# Patient Record
Sex: Female | Born: 1995 | Race: White | Hispanic: No | Marital: Single | Smoking: Never smoker
Health system: Southern US, Community
[De-identification: ages and names within clinical notes are randomized; demographics above are authoritative.]

## PROBLEM LIST (undated history)

## (undated) DIAGNOSIS — R519 Headache, unspecified: Secondary | ICD-10-CM

## (undated) DIAGNOSIS — F4310 Post-traumatic stress disorder, unspecified: Secondary | ICD-10-CM

## (undated) DIAGNOSIS — S0990XA Unspecified injury of head, initial encounter: Secondary | ICD-10-CM

## (undated) DIAGNOSIS — F5000 Anorexia nervosa, unspecified: Secondary | ICD-10-CM

## (undated) DIAGNOSIS — F419 Anxiety disorder, unspecified: Secondary | ICD-10-CM

## (undated) DIAGNOSIS — F32A Depression, unspecified: Secondary | ICD-10-CM

## (undated) DIAGNOSIS — S060XAA Concussion with loss of consciousness status unknown, initial encounter: Secondary | ICD-10-CM

## (undated) HISTORY — DX: Unspecified injury of head, initial encounter: S09.90XA

## (undated) HISTORY — DX: Headache, unspecified: R51.9

## (undated) HISTORY — DX: Post-traumatic stress disorder, unspecified: F43.10

## (undated) HISTORY — DX: Anorexia nervosa, unspecified: F50.00

## (undated) HISTORY — DX: Anxiety disorder, unspecified: F41.9

## (undated) HISTORY — DX: Depression, unspecified: F32.A

## (undated) HISTORY — DX: Concussion with loss of consciousness status unknown, initial encounter: S06.0XAA

---

## 2015-04-30 ENCOUNTER — Ambulatory Visit: Payer: Self-pay | Admitting: *Deleted

## 2016-02-29 ENCOUNTER — Other Ambulatory Visit (HOSPITAL_BASED_OUTPATIENT_CLINIC_OR_DEPARTMENT_OTHER): Payer: Self-pay | Admitting: Physician Assistant

## 2016-02-29 DIAGNOSIS — R5383 Other fatigue: Secondary | ICD-10-CM

## 2016-02-29 DIAGNOSIS — R109 Unspecified abdominal pain: Secondary | ICD-10-CM

## 2016-02-29 DIAGNOSIS — R110 Nausea: Secondary | ICD-10-CM

## 2016-04-05 ENCOUNTER — Ambulatory Visit (HOSPITAL_BASED_OUTPATIENT_CLINIC_OR_DEPARTMENT_OTHER): Admission: RE | Admit: 2016-04-05 | Payer: Self-pay | Source: Ambulatory Visit

## 2016-04-08 ENCOUNTER — Ambulatory Visit (HOSPITAL_BASED_OUTPATIENT_CLINIC_OR_DEPARTMENT_OTHER)
Admission: RE | Admit: 2016-04-08 | Discharge: 2016-04-08 | Disposition: A | Discharge status: Home / Self Care | Payer: Medicaid Other | Source: Ambulatory Visit | Attending: Physician Assistant | Admitting: Physician Assistant

## 2016-04-08 ENCOUNTER — Ambulatory Visit (HOSPITAL_BASED_OUTPATIENT_CLINIC_OR_DEPARTMENT_OTHER): Payer: Medicaid Other

## 2016-04-08 DIAGNOSIS — R109 Unspecified abdominal pain: Secondary | ICD-10-CM | POA: Diagnosis not present

## 2016-04-08 DIAGNOSIS — R110 Nausea: Secondary | ICD-10-CM | POA: Diagnosis not present

## 2016-04-08 DIAGNOSIS — R5383 Other fatigue: Secondary | ICD-10-CM | POA: Insufficient documentation

## 2017-04-22 IMAGING — US US ABDOMEN COMPLETE
1 series · 14 of 25 positions shown · non-contrast
Comparison: None.

CLINICAL DATA: 19-year-old female with intermittent abdominal pain,
nausea, and fatigue. Initial encounter.

EXAM:
ABDOMEN ULTRASOUND COMPLETE

[Series 1: us abdomen complete · 0.15mm/px · 14 of 66 slices shown]
[im 1/66]
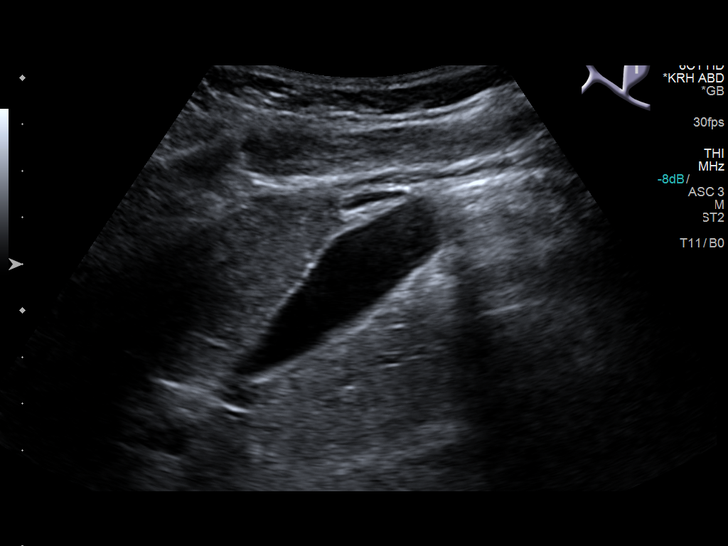
[im 6/66]
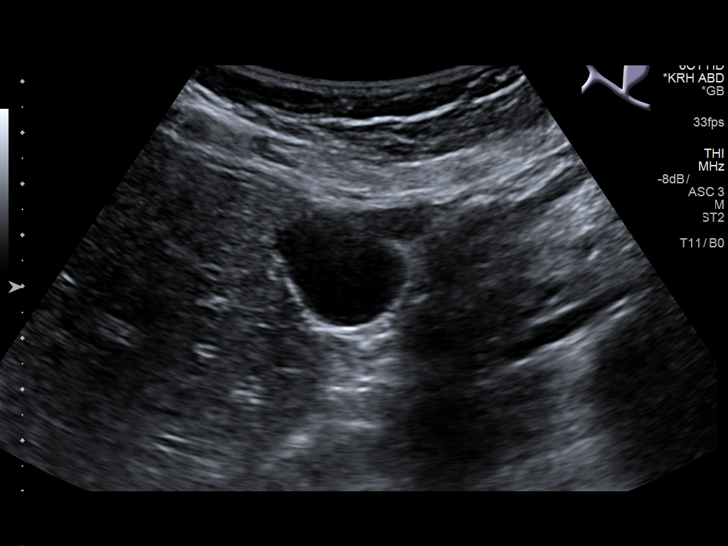
[im 11/66]
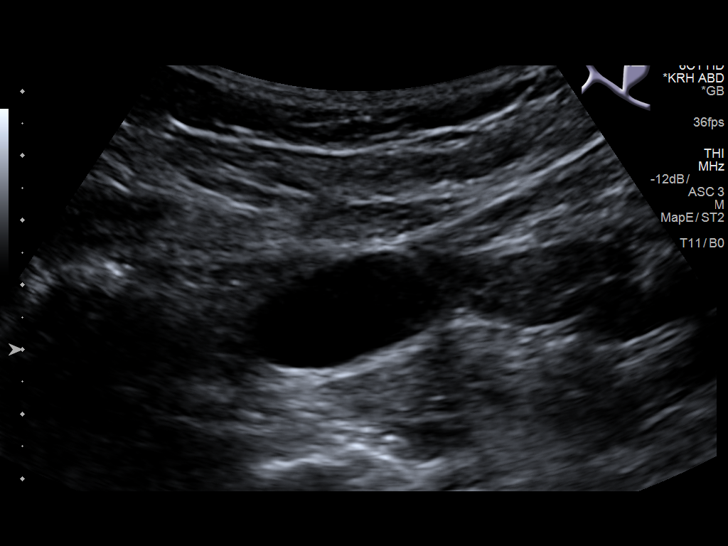
[im 17/66]
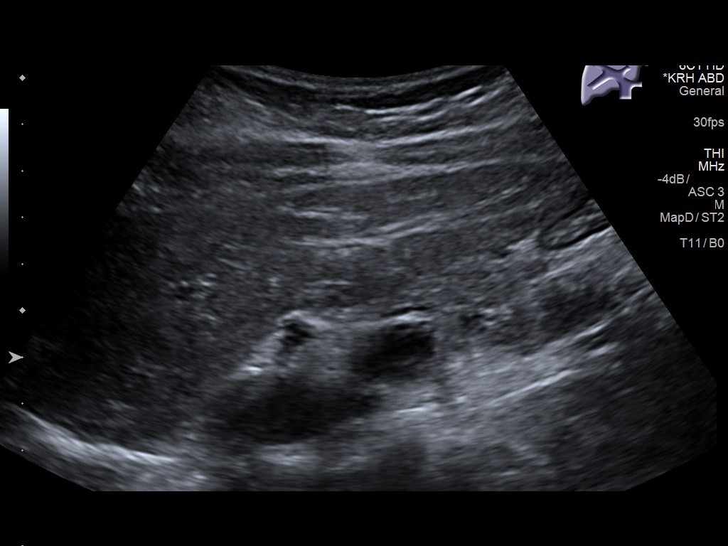
[im 22/66]
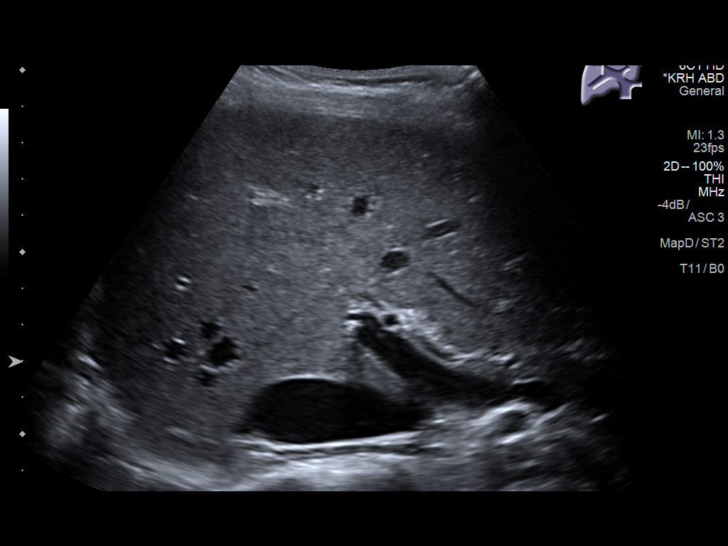
[im 25/66]
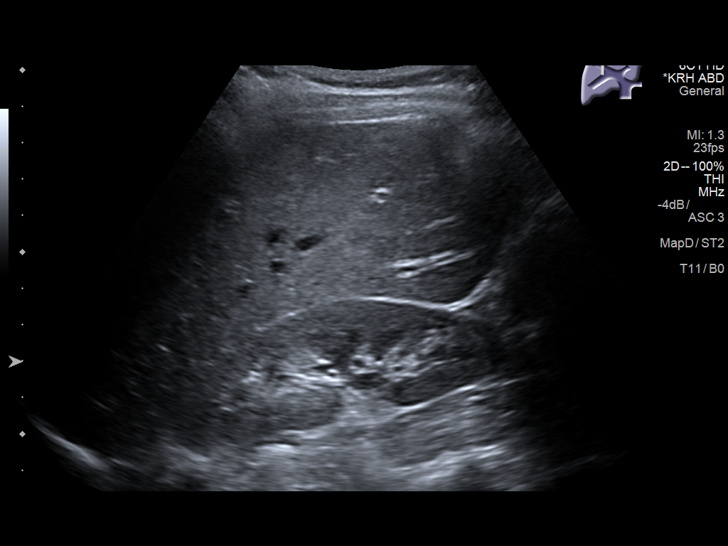
[im 30/66]
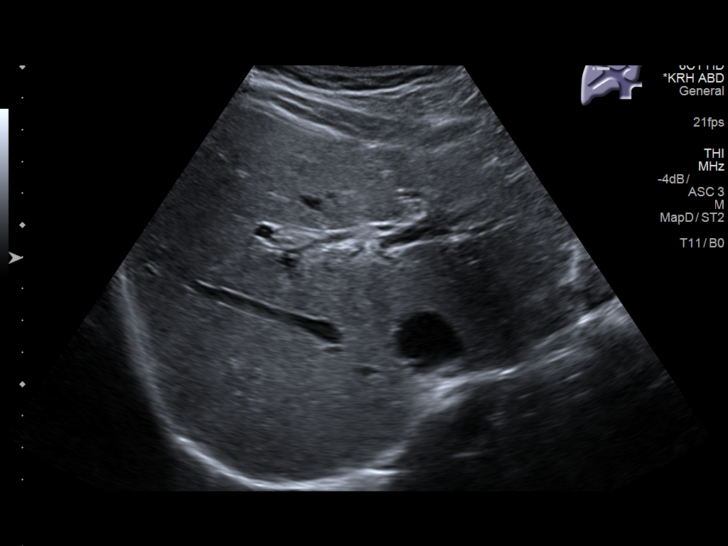
[im 36/66]
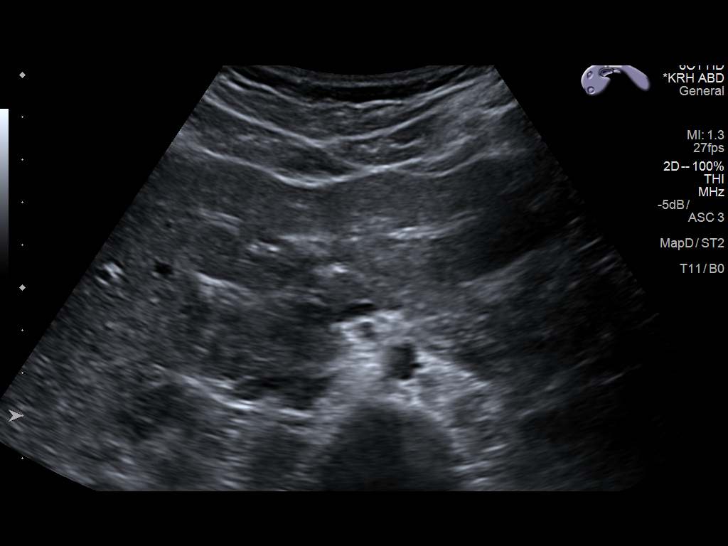
[im 41/66]
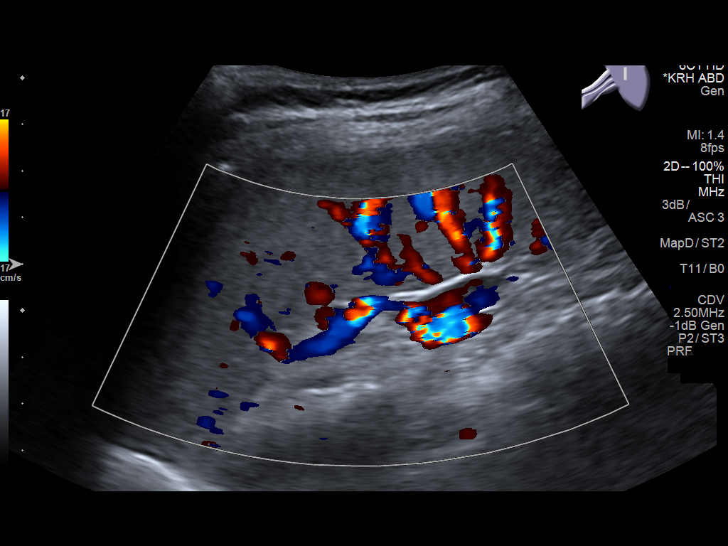
[im 44/66]
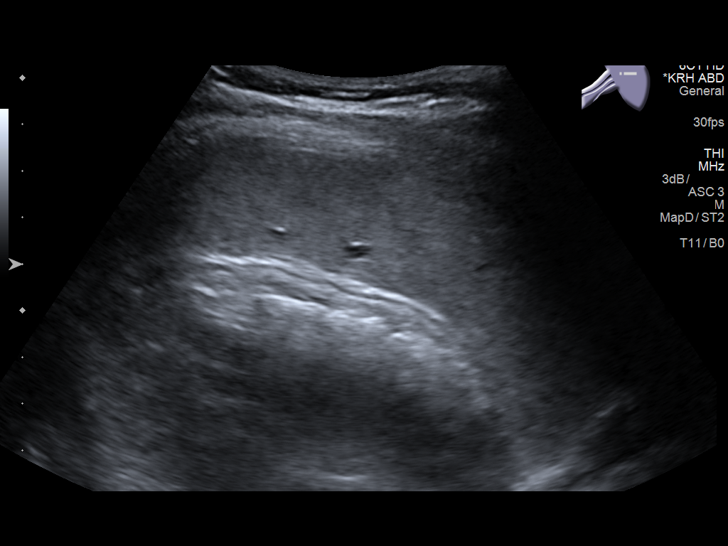
[im 49/66]
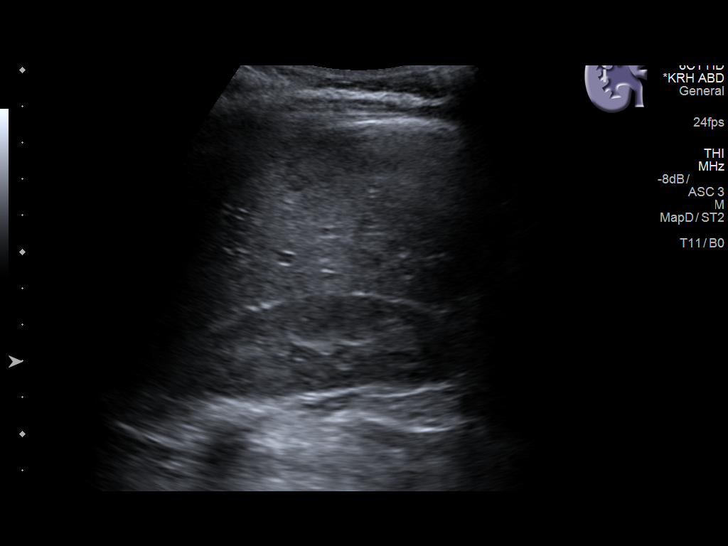
[im 55/66]
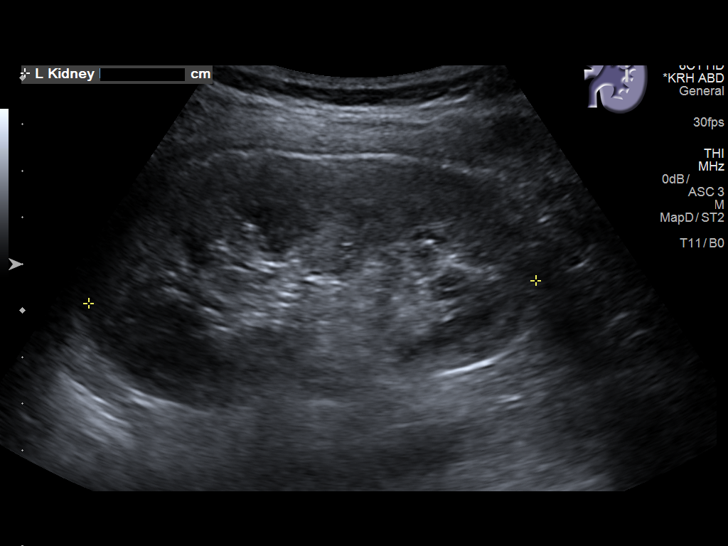
[im 60/66]
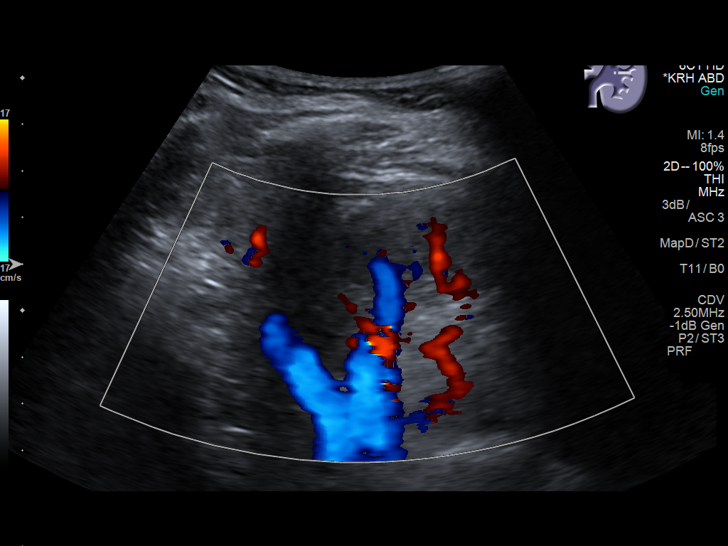
[im 66/66]
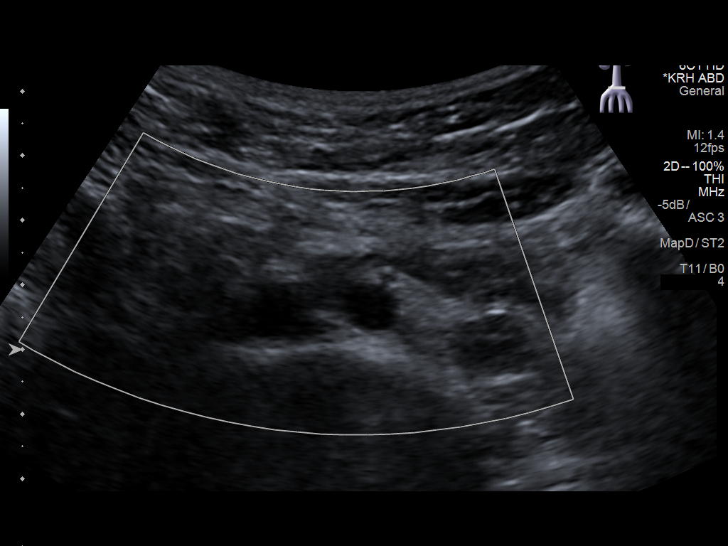

[14 of 25 positions shown; findings below may reference images not displayed]

FINDINGS: Gallbladder: No gallstones or wall thickening visualized. No
sonographic Murphy sign noted by sonographer.

Common bile duct: Diameter: 2 mm, normal

Liver: No focal lesion identified.  Normal parenchymal echogenicity.

IVC: No abnormality visualized.

Pancreas: Visualized portion unremarkable.

Spleen: Size and appearance within normal limits.

Right Kidney: Length: 10.1 cm. Echogenicity within normal limits. No
mass or hydronephrosis visualized.

Left Kidney: Length: 9.6 cm. Echogenicity within normal limits. No
mass or hydronephrosis visualized.

Abdominal aorta: No aneurysm visualized.

Other findings: No free fluid.
IMPRESSION: Normal abdomen ultrasound.

## 2021-11-15 ENCOUNTER — Ambulatory Visit: Payer: Federal, State, Local not specified - PPO | Admitting: Psychiatry

## 2021-12-06 ENCOUNTER — Other Ambulatory Visit: Payer: Self-pay | Admitting: *Deleted

## 2021-12-06 ENCOUNTER — Encounter: Payer: Self-pay | Admitting: *Deleted

## 2021-12-07 ENCOUNTER — Other Ambulatory Visit: Payer: Self-pay

## 2021-12-07 ENCOUNTER — Encounter: Payer: Self-pay | Admitting: Psychiatry

## 2021-12-07 ENCOUNTER — Ambulatory Visit: Payer: Federal, State, Local not specified - PPO | Admitting: Psychiatry

## 2021-12-07 VITALS — BP 134/80 | HR 91 | Ht 66.0 in | Wt 117.0 lb

## 2021-12-07 DIAGNOSIS — R519 Headache, unspecified: Secondary | ICD-10-CM

## 2021-12-07 DIAGNOSIS — F0781 Postconcussional syndrome: Secondary | ICD-10-CM | POA: Diagnosis not present
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
## 2021-12-07 NOTE — Patient Instructions (Addendum)
Start magnesium 500 mg daily for headache prevention Refer to speech therapy for cognitive rehab  Post Concussive Syndrome:  Post-concussion syndrome is a complex disorder in which various symptoms -- such as headaches and dizziness -- last for weeks and sometimes months after the injury that caused the concussion. Concussion is a mild traumatic brain injury, usually occurring after a blow to the head. Loss of consciousness isn't required for a diagnosis of concussion or post-concussion syndrome. In fact, the risk of post-concussion syndrome doesn't appear to be associated with the severity of the initial injury. In most people, post-concussion syndrome symptoms occur within the first seven to 10 days and go away within three months, though they can persist for a year or more. Post-concussion syndrome treatments are aimed at easing specific symptoms.  Post-concussion symptoms include: Headaches  Dizziness  Fatigue  Irritability  Anxiety  Insomnia  Loss of concentration and memory  Noise and light sensitivity  Headaches that occur after a concussion can vary and may feel like tension-type headaches or migraines. Most, however, are tension-type headaches, which may be associated with a neck injury that happened at the same time as the head injury. In some cases, people experience behavior or emotional changes after a mild traumatic brain injury. Family members may notice that the person has become more irritable, suspicious, argumentative or stubborn. When to see a doctor See a doctor if you experience a head injury severe enough to cause confusion or amnesia -- even if you never lost consciousness. If a concussion occurs while you're playing a sport, don't go back in the game. Seek medical attention so that you don't risk worsening your injury. Causes: Some experts believe post-concussion symptoms are caused by structural damage to the brain or disruption of neurotransmitter systems,  resulting from the impact that caused the concussion. Others believe post-concussion symptoms are related to psychological factors, especially since the most common symptoms -- headache, dizziness and sleep problems -- are similar to those often experienced by people diagnosed with depression, anxiety or post-traumatic stress disorder. In many cases, both physiological effects of brain trauma and emotional reactions to these effects play a role in the development of symptoms. Researchers haven't determined why some people who've had concussions develop persistent post-concussion symptoms while others do not. No proven correlation between the severity of the injury and the likelihood of developing persistent post-concussion symptoms exists.  Risk Factors: Risk factors for developing post-concussion syndrome include: Age. Studies have found increasing age to be a risk factor for post-concussion syndrome.  Sex. Women are more likely to be diagnosed with post-concussion syndrome, but this may be because women are generally more likely to seek medical care.  Trauma. Concussions resulting from car collisions, falls, assaults and sports injuries are commonly associated with post-concussion syndrome. Treatment: There is no specific treatment for post-concussion syndrome. Instead, your doctor will treat the individual symptoms you're experiencing. The types of symptoms and their frequency are unique to each person. Headaches Medications commonly used for migraines or tension headaches, including some antidepressants, appear to be effective when these types of headaches are associated with post-concussion syndrome. Examples include: Amitriptyline. This medication has been widely used for post-traumatic injuries, as well as for symptoms commonly associated with post-concussion syndrome, such as irritability, dizziness and depression. Topiramate. Commonly used to treat migraines, topiramate (Qudexy XR, Topamax,  Trokendi XR) may be effective in reducing headaches after head injury. Common side effects of topiramate include weight loss and cognitive problems.  Gabapentin. Gabapentin (Gralise, Neurontin)  is frequently used to treat a variety of types of pain and may be helpful in treating post-traumatic headaches. A common side effect of gabapentin is drowsiness.  Other agents used to treat migraines and tension-type headaches may also be helpful in some individuals. Keep in mind that the overuse of over-the-counter and prescription pain relievers may contribute to persistent post-concussion headaches. Memory and thinking problems No medications are currently recommended specifically for the treatment of cognitive problems after mild traumatic brain injury. Time may be the best therapy for post-concussion syndrome if you have cognitive problems, as most of them go away on their own in the weeks to months following the injury. Certain forms of cognitive therapy may be helpful, including focused rehabilitation that provides training in how to use a pocket calendar, Education officer, community or other techniques to work around memory deficits and attention skills. Relaxation therapy also may help. Dizziness Vestibular rehab (a specialized form of physical therapy can help this. Depression and anxiety The symptoms of post-concussion syndrome often improve after the affected person learns that there is a cause for his or her symptoms and that they will likely improve with time. Education about the disorder can ease a person's fears and help provide peace of mind. If you're experiencing new or increasing depression or anxiety after a concussion, some treatment options include: Psychotherapy. It may be helpful to discuss your concerns with a psychologist or psychiatrist who has experience in working with people with brain injury.  Medication. To combat anxiety or depression, antidepressants or anti-anxiety medications may be  prescribed. Prevention: The only known way to prevent post-concussion syndrome is to avoid the head injury in the first place. Avoiding head injuries Although you can't prepare for every potential situation, here are some tips for avoiding common causes of head injuries: Fasten your seat belt whenever you're traveling in a car, and be sure children are in age-appropriate safety seats. Children under 13 are safest riding in the back seat, especially if your car has air bags.  Use helmets whenever you or your children are bicycling, roller-skating, in-line skating, ice-skating, skiing, snowboarding, playing football, batting or running the bases in softball or baseball, skateboarding, or horseback riding. Wear a helmet when riding a motorcycle.  Take steps around the house to prevent falls, such as removing small area rugs, improving lighting and installing handrails.

## 2022-02-07 ENCOUNTER — Telehealth: Payer: Self-pay | Admitting: Psychiatry

## 2022-02-07 NOTE — Telephone Encounter (Signed)
Called patient, LVM requesting  a call back tomorrow. I informed her Dr Delena Bali is out of office x 2 weeks. Left #.   ?

## 2022-02-07 NOTE — Telephone Encounter (Signed)
Pt wanting to schedule a sooner appt or speak with nurse about Dr. Phoebe Sharps for work. ? Pt wanting to take time off work due to concussion symptoms, needs a doctors note.  ?Pt would like a call from the nurse.  ?[phone] ?

## 2022-02-08 NOTE — Telephone Encounter (Addendum)
I will NOT write working excuse for the reason listed ?

## 2022-02-08 NOTE — Telephone Encounter (Signed)
Patient returned call, stated her brain feels fatigued quickly. She works at a [employer] all day. She  has been unplugging from screens on weekends and not watching, just listening. She feels like her brain is burning from mid day to end of work day. She is wearing glasses with blue light filter and night light on her computer screen. When she steps away from the computer it goes away after 30 minutes.  She has projects at work she is trying to complete but is asking for a letter to be out of work 02/14/22 through 02/18/22 to "let her brain rest".  She is hoping this will correct her issue. ?I advised will send her request to Dr Terrace Arabia as work in [place] this morning, will let her know Dr Zannie Cove reply. Patient verbalized understanding, appreciation. ? ?  ? ?

## 2022-02-09 NOTE — Telephone Encounter (Signed)
Called patient and informed her Dr Krista Blue denied writing letter for her for reason listed. She will call back when Dr Billey Gosling is back in office, verbalized understanding, appreciation. ? ?

## 2022-02-23 ENCOUNTER — Encounter: Payer: Self-pay | Admitting: Psychiatry

## 2022-02-23 NOTE — Telephone Encounter (Signed)
Called patient, LVM informing her of Dr Quentin Mulling reply. Advised she call back if she is agreeable. I can e mail the letter if she can't pick it up.  ?

## 2022-02-23 NOTE — Telephone Encounter (Signed)
Pt would like letter sent to email [email or web address]. Can call back to confirm received the email. ?

## 2022-02-23 NOTE — Telephone Encounter (Addendum)
Letter signed and emailed to patient. Called patient and advised her . Patient verbalized understanding, appreciation. ? ?

## 2022-02-23 NOTE — Telephone Encounter (Signed)
We can write a letter to give her tomorrow and Friday off due to migraine and photophobia, thanks

## 2022-02-23 NOTE — Telephone Encounter (Signed)
Pt was instructed to call back within the 1st week of May.  Pt would like the request forwarded to Dr Billey Gosling for review re: her asking for a few days of sick time from work, please call. ?

## 2022-02-23 NOTE — Telephone Encounter (Signed)
I routed a letter to you if you could e-mail it to her, thanks

## 2022-05-30 ENCOUNTER — Ambulatory Visit: Payer: Federal, State, Local not specified - PPO | Admitting: Psychiatry
